# Patient Record
Sex: Female | Born: 1970 | Race: White | Hispanic: No | Marital: Married | Smoking: Current every day smoker
Health system: Southern US, Community
[De-identification: ages and names within clinical notes are randomized; demographics above are authoritative.]

## PROBLEM LIST (undated history)

## (undated) DIAGNOSIS — M549 Dorsalgia, unspecified: Secondary | ICD-10-CM

## (undated) DIAGNOSIS — F32A Depression, unspecified: Secondary | ICD-10-CM

## (undated) DIAGNOSIS — E079 Disorder of thyroid, unspecified: Secondary | ICD-10-CM

## (undated) DIAGNOSIS — G8929 Other chronic pain: Secondary | ICD-10-CM

## (undated) DIAGNOSIS — F329 Major depressive disorder, single episode, unspecified: Secondary | ICD-10-CM

## (undated) DIAGNOSIS — J45909 Unspecified asthma, uncomplicated: Secondary | ICD-10-CM

## (undated) DIAGNOSIS — F319 Bipolar disorder, unspecified: Secondary | ICD-10-CM

## (undated) DIAGNOSIS — F1110 Opioid abuse, uncomplicated: Secondary | ICD-10-CM

## (undated) DIAGNOSIS — M419 Scoliosis, unspecified: Secondary | ICD-10-CM

## (undated) HISTORY — PX: CHOLECYSTECTOMY: SHX55

---

## 2015-06-02 ENCOUNTER — Emergency Department (HOSPITAL_BASED_OUTPATIENT_CLINIC_OR_DEPARTMENT_OTHER)
Admission: EM | Admit: 2015-06-02 | Discharge: 2015-06-02 | Disposition: A | Discharge status: Home / Self Care | Payer: BLUE CROSS/BLUE SHIELD | Attending: Emergency Medicine | Admitting: Emergency Medicine

## 2015-06-02 ENCOUNTER — Encounter (HOSPITAL_BASED_OUTPATIENT_CLINIC_OR_DEPARTMENT_OTHER): Payer: Self-pay

## 2015-06-02 DIAGNOSIS — E079 Disorder of thyroid, unspecified: Secondary | ICD-10-CM | POA: Diagnosis not present

## 2015-06-02 DIAGNOSIS — M5440 Lumbago with sciatica, unspecified side: Secondary | ICD-10-CM | POA: Insufficient documentation

## 2015-06-02 DIAGNOSIS — Z79899 Other long term (current) drug therapy: Secondary | ICD-10-CM | POA: Insufficient documentation

## 2015-06-02 DIAGNOSIS — M545 Low back pain: Secondary | ICD-10-CM | POA: Diagnosis present

## 2015-06-02 DIAGNOSIS — Z87891 Personal history of nicotine dependence: Secondary | ICD-10-CM | POA: Insufficient documentation

## 2015-06-02 DIAGNOSIS — F329 Major depressive disorder, single episode, unspecified: Secondary | ICD-10-CM | POA: Insufficient documentation

## 2015-06-02 HISTORY — DX: Depression, unspecified: F32.A

## 2015-06-02 HISTORY — DX: Major depressive disorder, single episode, unspecified: F32.9

## 2015-06-02 HISTORY — DX: Disorder of thyroid, unspecified: E07.9

## 2015-06-02 HISTORY — DX: Scoliosis, unspecified: M41.9

## 2015-06-02 LAB — URINALYSIS, ROUTINE W REFLEX MICROSCOPIC
BILIRUBIN URINE: NEGATIVE
GLUCOSE, UA: NEGATIVE mg/dL
HGB URINE DIPSTICK: NEGATIVE
KETONES UR: NEGATIVE mg/dL
Leukocytes, UA: NEGATIVE
Nitrite: NEGATIVE
PROTEIN: NEGATIVE mg/dL
Specific Gravity, Urine: 1.042 — ABNORMAL HIGH (ref 1.005–1.030)
pH: 5 (ref 5.0–8.0)

## 2015-06-02 MED ORDER — OXYCODONE-ACETAMINOPHEN 5-325 MG PO TABS: 1.0000 | ORAL_TABLET | Freq: Four times a day (QID) | ORAL | Status: AC | PRN

## 2015-06-02 NOTE — ED Notes (Signed)
Pt reports chronic scoliosis pain that she needs relief from.

## 2015-06-02 NOTE — ED Provider Notes (Signed)
CSN: [id number]     Arrival date & time 06/02/15  1754 History   First MD Initiated Contact with Patient 06/02/15 1954     Chief Complaint  Patient presents with  . Back Pain     (Consider location/radiation/quality/duration/timing/severity/associated sxs/prior Treatment) HPI Comments: Patient presents today with a chief complaint of lower back pain.  She reports that the pain has been present for the past 2-3 weeks and is worsening.  She denies any injury or trauma.  She states that she had a recent MRI of her lumbar spine, which showed "arthritis" and a "bulging disc."  She states that the pain radiates to both legs.  She has been taking Tylenol and Goody Powder for the pain without any relief.  She denies fever, chills, saddle anaesthesia, or bowel/bladder incontinence.  No history of Cancer or IVDU.    Patient is a 45 y.o. female presenting with back pain. The history is provided by the patient.  Back Pain   Past Medical History  Diagnosis Date  . Scoliosis   . Depression   . Thyroid disease    Past Surgical History  Procedure Laterality Date  . Cesarean section    . Cholecystectomy     No family history on file. Social History  Substance Use Topics  . Smoking status: Former Games developer  . Smokeless tobacco: None  . Alcohol Use: No   OB History    No data available     Review of Systems  Musculoskeletal: Positive for back pain.  All other systems reviewed and are negative.     Allergies  Codeine  Home Medications   Prior to Admission medications   Medication Sig Start Date End Date Taking? Authorizing Provider  FLUoxetine (PROZAC) 10 MG capsule Take 10 mg by mouth daily.   Yes Historical Provider, MD  levothyroxine (SYNTHROID, LEVOTHROID) 100 MCG tablet Take 100 mcg by mouth daily before breakfast.   Yes Historical Provider, MD   BP 140/82 mmHg  Pulse 77  Temp(Src) 98.2 F (36.8 C) (Oral)  Resp 18  Ht  (1.6 m)  Wt 86.183 kg  BMI 33.67 kg/m2  SpO2  98% Physical Exam  Constitutional: She appears well-developed and well-nourished.  HENT:  Head: Normocephalic and atraumatic.  Neck: Normal range of motion. Neck supple.  Cardiovascular: Normal rate, regular rhythm and normal heart sounds.   Pulmonary/Chest: Effort normal and breath sounds normal.  Musculoskeletal: Normal range of motion.       Lumbar back: She exhibits tenderness and bony tenderness. She exhibits normal range of motion, no swelling, no edema and no deformity.  Neurological: She is alert. She has normal strength. No sensory deficit. Gait normal.  Reflex Scores:      Patellar reflexes are 2+ on the right side and 2+ on the left side. Skin: Skin is warm and dry.  Psychiatric: She has a normal mood and affect.  Nursing note and vitals reviewed.   ED Course  Procedures (including critical care time) Labs Review Labs Reviewed  URINALYSIS, ROUTINE W REFLEX MICROSCOPIC (NOT AT [facility]) - Abnormal; Notable for the following:    APPearance CLOUDY (*)    Specific Gravity, Urine 1.042 (*)    All other components within normal limits    Imaging Review No results found. I have personally reviewed and evaluated these images and lab results as part of my medical decision-making.   EKG Interpretation None      MDM   Final diagnoses:  None  Patient with back pain.  No neurological deficits and normal neuro exam.  Patient can walk but states is painful.  No loss of bowel or bladder control.  No concern for cauda equina.  No fever, night sweats, weight loss, h/o cancer, IVDU.  RICE protocol and pain medicine indicated and discussed with patient.   Stable for discharge.  Return precautions given.     Santiago GladHeather Shanautica Forker, PA-C 06/04/15 1635  Nelva Nayobert Beaton, MD 06/05/15 [phone]

## 2015-09-12 ENCOUNTER — Emergency Department (HOSPITAL_BASED_OUTPATIENT_CLINIC_OR_DEPARTMENT_OTHER)
Admission: EM | Admit: 2015-09-12 | Discharge: 2015-09-12 | Disposition: A | Discharge status: Home / Self Care | Payer: BLUE CROSS/BLUE SHIELD | Attending: Emergency Medicine | Admitting: Emergency Medicine

## 2015-09-12 ENCOUNTER — Encounter (HOSPITAL_BASED_OUTPATIENT_CLINIC_OR_DEPARTMENT_OTHER): Payer: Self-pay | Admitting: *Deleted

## 2015-09-12 DIAGNOSIS — Z79899 Other long term (current) drug therapy: Secondary | ICD-10-CM | POA: Insufficient documentation

## 2015-09-12 DIAGNOSIS — F329 Major depressive disorder, single episode, unspecified: Secondary | ICD-10-CM | POA: Insufficient documentation

## 2015-09-12 DIAGNOSIS — M533 Sacrococcygeal disorders, not elsewhere classified: Secondary | ICD-10-CM | POA: Insufficient documentation

## 2015-09-12 DIAGNOSIS — Z87891 Personal history of nicotine dependence: Secondary | ICD-10-CM | POA: Insufficient documentation

## 2015-09-12 MED ORDER — CYCLOBENZAPRINE HCL 10 MG PO TABS
10.0000 mg | ORAL_TABLET | Freq: Once | ORAL | Status: AC
  Administered 2015-09-12: 10 mg via ORAL
  Filled 2015-09-12: qty 1

## 2015-09-12 MED ORDER — OXYCODONE-ACETAMINOPHEN 5-325 MG PO TABS
2.0000 | ORAL_TABLET | Freq: Once | ORAL | Status: AC
  Administered 2015-09-12: 2 via ORAL
  Filled 2015-09-12: qty 2

## 2015-09-12 MED ORDER — METHYLPREDNISOLONE 4 MG PO TBPK: ORAL_TABLET | ORAL | Status: AC

## 2015-09-12 MED ORDER — ORPHENADRINE CITRATE ER 100 MG PO TB12: 100.0000 mg | ORAL_TABLET | Freq: Two times a day (BID) | ORAL | Status: AC

## 2015-09-12 MED ORDER — KETOROLAC TROMETHAMINE 60 MG/2ML IM SOLN
60.0000 mg | Freq: Once | INTRAMUSCULAR | Status: AC
  Administered 2015-09-12: 60 mg via INTRAMUSCULAR
  Filled 2015-09-12: qty 2

## 2015-09-12 NOTE — ED Notes (Signed)
Per pt report ongoing lower back ongoing , planned surgery in the future. Worked in yard and today in increase pain.

## 2015-09-12 NOTE — ED Provider Notes (Signed)
CSN: [id number]     Arrival date & time 09/12/15  1448 History   First MD Initiated Contact with Patient 09/12/15 1638     Chief Complaint  Patient presents with  . Back Pain     (Consider location/radiation/quality/duration/timing/severity/associated sxs/prior Treatment) HPI Patient does have chronic lower back pain and has had steroid injection therapy without relief. She typically is prescribed Percocet for pain control. At this time she has run out of that medication. She reports that she had been working in the yard doing yard work and weeding over the past day or 2 and now has significantly increased lower back pain. It does not radiate. No abdominal pain. No pain burning or urgency. Urination. No difficulty with bowel or bladder function. Past Medical History  Diagnosis Date  . Scoliosis   . Depression   . Thyroid disease    Past Surgical History  Procedure Laterality Date  . Cesarean section    . Cholecystectomy     History reviewed. No pertinent family history. Social History  Substance Use Topics  . Smoking status: Former Games developer  . Smokeless tobacco: None  . Alcohol Use: No   OB History    No data available     Review of Systems 10 Systems reviewed and are negative for acute change except as noted in the HPI.    Allergies  Review of patient's allergies indicates no active allergies.  Home Medications   Prior to Admission medications   Medication Sig Start Date End Date Taking? Authorizing Provider  FLUoxetine (PROZAC) 10 MG capsule Take 10 mg by mouth daily.   Yes Historical Provider, MD  levothyroxine (SYNTHROID, LEVOTHROID) 100 MCG tablet Take 100 mcg by mouth daily before breakfast.   Yes Historical Provider, MD  oxyCODONE-acetaminophen (PERCOCET/ROXICET) 5-325 MG tablet Take 1-2 tablets by mouth every 6 (six) hours as needed for severe pain. 06/02/15  Yes Heather Laisure, PA-C  methylPREDNISolone (MEDROL DOSEPAK) 4 MG TBPK tablet Dosepak as per  instructions 09/12/15   Arby Barrette, MD  orphenadrine (NORFLEX) 100 MG tablet Take 1 tablet (100 mg total) by mouth 2 (two) times daily. 09/12/15   Arby Barrette, MD   BP 141/95 mmHg  Pulse 79  Temp(Src) 98 F (36.7 C) (Oral)  Resp 20  Ht  (1.6 m)  Wt 230 lb (104.327 kg)  BMI 40.75 kg/m2  SpO2 100% Physical Exam  Constitutional: She is oriented to person, place, and time.  Morbid obesity. Alert and nontoxic. Patient is lying prone on the stretcher as I enter the room. No respiratory difficulty.  HENT:  Head: Normocephalic and atraumatic.  Eyes: EOM are normal.  Neck: Neck supple.  Cardiovascular: Normal rate, regular rhythm, normal heart sounds and intact distal pulses.   Pulmonary/Chest: Effort normal and breath sounds normal.  Abdominal: Soft. Bowel sounds are normal. She exhibits no distension. There is no tenderness.  Musculoskeletal: Normal range of motion. She exhibits tenderness. She exhibits no edema.  Patient endorses focal tenderness at the last lumbosacral vertebral body and the bilateral SI joints. Nontender thoracic and lumbar back. No CVA tenderness. Patient is able to roll over from prone to supine without difficulty. All muscle bodies functioning and full range of motion using both legs and arms to reposition herself.  A 5 out of 5 strength testing for flexion and extension of the lower extremities.  Neurological: She is alert and oriented to person, place, and time. She [id number] strength. She exhibits normal muscle tone. Coordination normal. GCS  eye subscore is 4. GCS verbal subscore is 5. GCS motor subscore is 6.  Skin: Skin is warm, dry and intact.  Psychiatric: She has a normal mood and affect.    ED Course  Procedures (including critical care time) Labs Review Labs Reviewed - No data to display  Imaging Review No results found. I have personally reviewed and evaluated these images and lab results as part of my medical decision-making.   EKG  Interpretation None      MDM   Final diagnoses:  Sacroiliac joint pain   Patient has chronic lower back pain. This is been exacerbated by doing yard work over the past day. No associated neurologic symptoms. No associated GU symptoms. No associated GI symptoms. His full and normal motor and neurologic function. Patient be treated for pain in the emergency department with an IM shot of Toradol and by mouth Percocet. Patient will be discharged with Medrol Dosepak and Norflex. She is advised to contact her family doctor as soon as possible to discuss prescriptions for chronic pain control with Percocet. She reports that the office was closed today by the time she was able to contact them.    Arby BarretteMarcy Aiyden Lauderback, MD 09/12/15 [phone]

## 2015-09-12 NOTE — Discharge Instructions (Signed)
Sacroiliac Joint Dysfunction Call your treating physician Monday to discuss ongoing management of acute on chronic back pain. Narcotic pain prescriptions will need to be provided by her treating physician. Take Medrol dosepak as prescribed and Norflex (muscle relaxer) twice daily. Sacroiliac joint dysfunction is a condition that causes inflammation on one or both sides of the sacroiliac (SI) joint. The SI joint connects the lower part of the spine (sacrum) with the two upper portions of the pelvis (ilium). This condition causes deep aching or burning pain in the low back. In some cases, the pain may also spread into one or both buttocks or hips or spread down the legs. CAUSES This condition may be caused by:  Pregnancy. During pregnancy, extra stress is put on the SI joints because the pelvis widens.  Injury, such as:  Car accidents.  Sport-related injuries.  Work-related injuries.  Having one leg that is shorter than the other.  Conditions that affect the joints, such as:  Rheumatoid arthritis.  Gout.  Psoriatic arthritis.  Joint infection (septic arthritis). Sometimes, the cause of SI joint dysfunction is not known. SYMPTOMS Symptoms of this condition include:  Aching or burning pain in the lower back. The pain may also spread to other areas, such as:  Buttocks.  Groin.  Thighs and legs.  Muscle spasms in or around the painful areas.  Increased pain when standing, walking, running, stair climbing, bending, or lifting. DIAGNOSIS Your health care provider will do a physical exam and take your medical history. During the exam, the health care provider may move one or both of your legs to different positions to check for pain. Various tests may be done to help verify the diagnosis, including:  Imaging tests to look for other causes of pain. These may include:  MRI.  CT scan.  Bone scan.  Diagnostic injection. A numbing medicine is injected into the SI joint using a  needle. If the pain is temporarily improved or stopped after the injection, this can indicate that SI joint dysfunction is the problem. TREATMENT Treatment may vary depending on the cause and severity of your condition. Treatment options may include:  Applying ice or heat to the lower back area. This can help to reduce pain and muscle spasms.  Medicines to relieve pain or inflammation or to relax the muscles.  Wearing a back brace (sacroiliac brace) to help support the joint while your back is healing.  Physical therapy to increase muscle strength around the joint and flexibility at the joint. This may also involve learning proper body positions and ways of moving to relieve stress on the joint.  Direct manipulation of the SI joint.  Injections of steroid medicine into the joint in order to reduce pain and swelling.  Radiofrequency ablation to burn away nerves that are carrying pain messages from the joint.  Use of a device that provides electrical stimulation in order to reduce pain at the joint.  Surgery to put in screws and plates that limit or prevent joint motion. This is rare. HOME CARE INSTRUCTIONS  Rest as needed. Limit your activities as directed by your health care provider.  Take medicines only as directed by your health care provider.  If directed, apply ice to the affected area:  Put ice in a plastic bag.  Place a towel between your skin and the bag.  Leave the ice on for 20 minutes, 2-3 times per day.  Use a heating pad or a moist heat pack as directed by your health care  provider.  Exercise as directed by your health care provider or physical therapist.  Keep all follow-up visits as directed by your health care provider. This is important. SEEK MEDICAL CARE IF:  Your pain is not controlled with medicine.  You have a fever.  You have increasingly severe pain. SEEK IMMEDIATE MEDICAL CARE IF:  You have weakness, numbness, or tingling in your legs or  feet.  You lose control of your bladder or bowel.   This information is not intended to replace advice given to you by your health care provider. Make sure you discuss any questions you have with your health care provider.   Document Released: 06/11/2008 Document Revised: 07/30/2014 Document Reviewed: 11/20/2013 Elsevier Interactive Patient Education 2016 [employer].  [employer] Guide Outpatient Counseling/Substance Abuse Adult The United Ways 211 is a great source of information about community services available.  Access by dialing 2-1-1 from anywhere in [place], or by website -  PooledIncome.pl.   Other Local Resources (Updated 03/2015)  Crisis Hotlines   Services     Area Served  [employer]  Crisis Hotline, available 24 hours a day, 7 days a week: 832-725-8869 [facility], [place]   [facility]  Crisis Hotline, available 24 hours a day, 7 days a week: 705-017-3507 [facility], [place]  [facility]  Suicide Prevention Hotline, available 24 hours a day, 7 days a week: (647)062-8804 [facility], [place]  [employer], available 24 hours a day, 7 days a week: 660-579-5804 [facility], [place]   [facility] Access to [employer], available 24 hours a day, 7 days a week: 229-879-7104 All   [facility]  Crisis Hotline, available 24 hours a day, 7 days a week: 413-319-6386 All   Other Local Resources (Updated 03/2015)  Outpatient Counseling/ Substance Abuse Programs  Services     Address and Phone Number  ADS (Alcohol and Drug Services)   Options include Individual counseling, group counseling, intensive outpatient program (several hours a day, several days a week)  Offers depression assessments  Provides methadone maintenance program [phone] [address] [place], [place] [address]   [facility]   Offers partial hospitalization/day  treatment and DUI/DWI programs  [employer], private insurance [phone] [address] [place], [place] [address]  [facility]    Services include intensive outpatient program (several hours a day, several days a week), outpatient treatment, DUI/DWI services, family education  Also has some services specifically for [employer] transitional housing  [phone] [address] [place], [place] [address]     [facility]  [employer], private pay, and private insurance [phone] [address], [address] [place], [place] [address]  [employer] [facility]  Services include individual counseling, substance abuse intensive outpatient program (several hours a day, several days a week), day treatment  Delene Loll, Medicaid, private insurance [phone] [address] [place], [place] [address]  [facility]   Offers substance abuse intensive outpatient program (several hours a day, several days a week), partial hospitalization program [phone] [address] [place], [place] [address]  [phone] [address] [place], [place] [address]  [phone] [address] [place], [place] [address]  [phone] [phone], [address] [place], [place] [address]  [facility]  Individual counseling only  Accepts private insurance only [phone] [address] [place], [place] [address]  [facility]  [facility]  Methadone maintenance program [phone] [address] [place], [place] [address]  [facility]  Walk-In Clinic providing substance abuse and mental health counseling  Accepts Medicaid, Medicare, private insurance  Offers sliding scale for uninsured [phone] [address] [place], [place]   [facility] [employer]  Offers individual counseling, and intensive in-home services [phone] [address], [address] [place], [place] [address]  [facility]  Offers individual counseling, family counseling, group therapy, domestic violence counseling, consumer credit counseling  Accepts Medicare, Medicaid, private insurance  Offers sliding scale for uninsured [phone] [address] [place], [place] [address]  [phone] [facility], [address] [place], [place] [address]  [facility]  Offers individual, family and group counseling  3 locations - [place], [place], and [place]  [phone]  [address] [place], [place] [address]  [address] [place], [place] [address]  [address] [place], [place] [address]  [facility]    Offers psychiatric assessment, 8-week Intensive Outpatient Program (several hours a day, several times a week, daytime or evenings), early recovery group, family Program, medication management  Private pay or private insurance only [phone], or  [phone] [address] [place], [place] [address]  [facility]  Offers individual, couples and family counseling  Accepts Medicaid, private insurance, and sliding scale for uninsured [phone] [address] [place], [place] [address]  Len Blalockavid Fuller, MD  Individual counseling  Private insurance [phone] [address] [place], [place] [address]  [facility]   Offers assessment, substance abuse treatment, and behavioral health treatment [phone] [address] [place], [place] [address]  [facility]  Individual counseling  Accepts private insurance [phone] [address] [place], [place] [address]  [facility]  Individual counseling  Delene Lollccepts Medicare, private insurance [phone] [address] [place], [place] [address]  [facility]    Offers intensive outpatient program (several hours a day, several times a  week)  Private pay, private insurance [phone] [facility] [place], [place]  [facility]  Individual counseling  Medicare, private insurance [phone] [address], [address] [place], [place] [address]  [facility]    Offers intensive outpatient program (several hours a day, several times a week) and partial hospitalization program [phone] [place], [place] [address]  Emerson MonteParrish McKinney, MD  Individual counseling [phone] [address], [address] [place], [place] [address]  [facility]  Offers Christian counseling to individuals, couples, and families  Accepts Medicare and private insurance; offers sliding scale for uninsured [phone] [address] [place], [place] [address]  [facility] [phone] [address], [address] [place], [place] [address]  [facility]   Offers crisis counseling, individual counseling, group therapy, in-home therapy, domestic violence services, day treatment, DWI services, Administrator, artsCommunity Support Team (CST), Assertive Community Treatment Team (ACTT), substance abuse Intensive Outpatient Program (several hours a day, several times a week)  2 locations - [place] and [place] [phone] [address] [place], [place] [address]  [phone] [address] [place], [place] [address]  [facility]     Individual counseling and group therapy  Accepts private insurance, [place], [place]icaid [phone] [address] [place], [place]  [facility]  Offers individual and family counseling  Offers LGBTQ services  Accepts private insurance and private pay [phone] [address] [place], [place] [address]  [facility]    Offers individual counseling, group therapy, and outpatient detox  Accepts private insurance [phone] [address] [place], [place]  [facility]  Individual counseling  Accepts Medicare, private insurance [phone] [address] [place], [place] [address]  [employer]  Individual counseling  Accepts Medicare, private insurance [phone] [address] [place], [place] [address]  [facility]   Offers substance abuse Intensive Outpatient Program (several hours a day, several times a week) [phone], or [phone] [place], [place]

## 2015-09-12 NOTE — ED Notes (Signed)
Encouraged Pt. To seek attention for other needs and gave paper work according to further conversation with Pt.   Pt. Receptive to help and in need of a listening ear before discharge from the facility.

## 2015-09-13 ENCOUNTER — Emergency Department (HOSPITAL_BASED_OUTPATIENT_CLINIC_OR_DEPARTMENT_OTHER)
Admission: EM | Admit: 2015-09-13 | Discharge: 2015-09-13 | Disposition: A | Discharge status: Home / Self Care | Payer: BLUE CROSS/BLUE SHIELD | Attending: Emergency Medicine | Admitting: Emergency Medicine

## 2015-09-13 ENCOUNTER — Encounter (HOSPITAL_BASED_OUTPATIENT_CLINIC_OR_DEPARTMENT_OTHER): Payer: Self-pay | Admitting: Emergency Medicine

## 2015-09-13 DIAGNOSIS — Z87891 Personal history of nicotine dependence: Secondary | ICD-10-CM | POA: Insufficient documentation

## 2015-09-13 DIAGNOSIS — Z79899 Other long term (current) drug therapy: Secondary | ICD-10-CM | POA: Insufficient documentation

## 2015-09-13 DIAGNOSIS — M5440 Lumbago with sciatica, unspecified side: Secondary | ICD-10-CM | POA: Insufficient documentation

## 2015-09-13 DIAGNOSIS — R509 Fever, unspecified: Secondary | ICD-10-CM | POA: Insufficient documentation

## 2015-09-13 DIAGNOSIS — M545 Low back pain: Secondary | ICD-10-CM

## 2015-09-13 MED ORDER — HYDROMORPHONE HCL 1 MG/ML IJ SOLN
2.0000 mg | Freq: Once | INTRAMUSCULAR | Status: AC
  Administered 2015-09-13: 2 mg via INTRAMUSCULAR
  Filled 2015-09-13: qty 2

## 2015-09-13 MED ORDER — ETODOLAC 500 MG PO TABS: 500.0000 mg | ORAL_TABLET | Freq: Two times a day (BID) | ORAL | Status: AC

## 2015-09-13 MED ORDER — IBUPROFEN 400 MG PO TABS
600.0000 mg | ORAL_TABLET | Freq: Once | ORAL | Status: AC
  Administered 2015-09-13: 600 mg via ORAL
  Filled 2015-09-13: qty 1

## 2015-09-13 MED ORDER — OXYCODONE-ACETAMINOPHEN 5-325 MG PO TABS
1.0000 | ORAL_TABLET | Freq: Once | ORAL | Status: AC
  Administered 2015-09-13: 1 via ORAL
  Filled 2015-09-13: qty 1

## 2015-09-13 NOTE — ED Notes (Signed)
MD at bedside. 

## 2015-09-13 NOTE — ED Provider Notes (Signed)
CSN: [id number]     Arrival date & time 09/13/15  1922 History  By signing my name below, I, Karen Montoya, attest that this documentation has been prepared under the direction and in the presence of Karen DibblesJon Jaemarie Hochberg, MD. Electronically Signed: Bethel BornBritney Montoya, ED Scribe. 09/13/2015. 8:32 PM    Chief Complaint  Patient presents with  . Back Pain   The history is provided by the patient. No language interpreter was used.   Karen Montoya is a 45 y.o. female who presents to the Emergency Department complaining of constant, dull, 10/10 in severity,  bilateral back pain with onset "a couple months ago". She has been seen by Dr. Chilton SiGreen and diagnosed with sciatica and a herniated disk. She was given steroid injections for the pain with no relief. The pain radiates to the knees bilaterally. Tylenol and the application of hot water also provided insufficient pain relief at home.  Pt believes that this exacerbation was caused by doing yard work.  Pt denies fever and difficulty urinating.   Past Medical History  Diagnosis Date  . Scoliosis   . Depression   . Thyroid disease    Past Surgical History  Procedure Laterality Date  . Cesarean section    . Cholecystectomy     History reviewed. No pertinent family history. Social History  Substance Use Topics  . Smoking status: Former Games developermoker  . Smokeless tobacco: None  . Alcohol Use: No   OB History    No data available     Review of Systems  Constitutional: Positive for fever.  Genitourinary: Negative for difficulty urinating.  Musculoskeletal: Positive for back pain.  All other systems reviewed and are negative.  Allergies  Review of patient's allergies indicates no known allergies.  Home Medications   Prior to Admission medications   Medication Sig Start Date End Date Taking? Authorizing Provider  FLUoxetine (PROZAC) 10 MG capsule Take 10 mg by mouth daily.   Yes Historical Provider, MD  levothyroxine (SYNTHROID, LEVOTHROID) 100 MCG  tablet Take 100 mcg by mouth daily before breakfast.   Yes Historical Provider, MD  etodolac (LODINE) 500 MG tablet Take 1 tablet (500 mg total) by mouth 2 (two) times daily. 09/13/15   Karen DibblesJon Avari Gelles, MD  methylPREDNISolone (MEDROL DOSEPAK) 4 MG TBPK tablet Dosepak as per instructions 09/12/15   Arby BarretteMarcy Pfeiffer, MD  orphenadrine (NORFLEX) 100 MG tablet Take 1 tablet (100 mg total) by mouth 2 (two) times daily. 09/12/15   Arby BarretteMarcy Pfeiffer, MD  oxyCODONE-acetaminophen (PERCOCET/ROXICET) 5-325 MG tablet Take 1-2 tablets by mouth every 6 (six) hours as needed for severe pain. 06/02/15   Heather Laisure, PA-C   BP 145/72 mmHg  Pulse 85  Temp(Src) 98.1 F (36.7 C) (Oral)  Resp 20  Ht 5\' 3"  (1.6 m)  Wt 216 lb (97.977 kg)  BMI 38.27 kg/m2  SpO2 100% Physical Exam  Constitutional: She appears well-developed and well-nourished. No distress.  HENT:  Head: Normocephalic and atraumatic.  Right Ear: External ear normal.  Left Ear: External ear normal.  Eyes: Conjunctivae are normal. Right eye exhibits no discharge. Left eye exhibits no discharge. No scleral icterus.  Neck: Neck supple. No tracheal deviation present.  Cardiovascular: Normal rate.   Pulmonary/Chest: Effort normal. No stridor. No respiratory distress.  Musculoskeletal: She exhibits no edema.       Lumbar back: She exhibits tenderness and bony tenderness. She exhibits no swelling, no edema and no deformity.  Normal sensation and strength in the feet. Normal plantar and dorsiflexion.  Neurological: She is alert. Cranial nerve deficit: no gross deficits.  Skin: Skin is warm and dry. No rash noted.  Psychiatric: She has a normal mood and affect.  Nursing note and vitals reviewed.   ED Course  Procedures (including critical care time) DIAGNOSTIC STUDIES: Oxygen Saturation is 100% on RA,  normal by my interpretation.    COORDINATION OF CARE: 8:29 PM Discussed treatment plan which includes pain medication with pt at bedside and pt agreed to  plan.  Medications  oxyCODONE-acetaminophen (PERCOCET/ROXICET) 5-325 MG per tablet 1 tablet (not administered)  ibuprofen (ADVIL,MOTRIN) tablet 600 mg (not administered)  HYDROmorphone (DILAUDID) injection 2 mg (2 mg Intramuscular Given 09/13/15 2052)     MDM   Final diagnoses:  Low back pain, unspecified back pain laterality, with sciatica presence unspecified   Chronic back pain without acute deficits. Discussed pain management with patient.  She requested narcotic prescriptions.  I explained that it would be appropriate to address this with her doctor.  Will give a dose of percocet before she goes.  Rx lodine for pain.  Continue meds from the other day.  I personally performed the services described in this documentation, which was scribed in my presence.  The recorded information has been reviewed and is accurate.    Karen Dibbles, MD 09/13/15 2216

## 2015-09-13 NOTE — ED Notes (Addendum)
[facility], pt seen by MD prior to RN assessments, see MD notes, pending orders, pt lying prone on elbows, NAD, calm, interactive, [facility]

## 2015-09-13 NOTE — Discharge Instructions (Signed)

## 2015-09-13 NOTE — ED Notes (Signed)
Pt reports history of chronic back pain x 3 months with radiation of pain to bilteral knees, denies voiding problems, pt states she is currently under the care of dr. August Saucerean [facility] for evaluation of cause contributing to pain

## 2016-06-24 ENCOUNTER — Emergency Department (HOSPITAL_BASED_OUTPATIENT_CLINIC_OR_DEPARTMENT_OTHER)
Admission: EM | Admit: 2016-06-24 | Discharge: 2016-06-24 | Disposition: A | Discharge status: Home / Self Care | Payer: Managed Care, Other (non HMO) | Attending: Emergency Medicine | Admitting: Emergency Medicine

## 2016-06-24 ENCOUNTER — Encounter (HOSPITAL_BASED_OUTPATIENT_CLINIC_OR_DEPARTMENT_OTHER): Payer: Self-pay | Admitting: Emergency Medicine

## 2016-06-24 DIAGNOSIS — F1193 Opioid use, unspecified with withdrawal: Secondary | ICD-10-CM

## 2016-06-24 DIAGNOSIS — F17200 Nicotine dependence, unspecified, uncomplicated: Secondary | ICD-10-CM | POA: Diagnosis not present

## 2016-06-24 DIAGNOSIS — F1123 Opioid dependence with withdrawal: Secondary | ICD-10-CM | POA: Diagnosis present

## 2016-06-24 HISTORY — DX: Other chronic pain: G89.29

## 2016-06-24 HISTORY — DX: Dorsalgia, unspecified: M54.9

## 2016-06-24 MED ORDER — LOPERAMIDE HCL 2 MG PO CAPS: 2.0000 mg | ORAL_CAPSULE | ORAL | Status: DC | PRN

## 2016-06-24 MED ORDER — HYDROXYZINE HCL 25 MG PO TABS
25.0000 mg | ORAL_TABLET | Freq: Once | ORAL | Status: AC
Start: 2016-06-24 — End: 2016-06-24
  Administered 2016-06-24: 25 mg via ORAL
  Filled 2016-06-24: qty 1

## 2016-06-24 MED ORDER — METHOCARBAMOL 500 MG PO TABS: 500.0000 mg | ORAL_TABLET | Freq: Two times a day (BID) | ORAL | 0 refills | Status: AC

## 2016-06-24 MED ORDER — LORAZEPAM 2 MG/ML IJ SOLN
0.5000 mg | Freq: Once | INTRAMUSCULAR | Status: AC
  Administered 2016-06-24: 0.5 mg via INTRAVENOUS
  Filled 2016-06-24: qty 1

## 2016-06-24 MED ORDER — ONDANSETRON 4 MG PO TBDP
4.0000 mg | ORAL_TABLET | Freq: Four times a day (QID) | ORAL | Status: DC | PRN
Start: 2016-06-24 — End: 2016-06-24

## 2016-06-24 MED ORDER — HYDROXYZINE HCL 25 MG PO TABS: 25.0000 mg | ORAL_TABLET | Freq: Four times a day (QID) | ORAL | 0 refills | Status: AC

## 2016-06-24 MED ORDER — HYDROXYZINE HCL 25 MG PO TABS: 25.0000 mg | ORAL_TABLET | Freq: Four times a day (QID) | ORAL | Status: DC | PRN

## 2016-06-24 MED ORDER — ONDANSETRON 4 MG PO TBDP
4.0000 mg | ORAL_TABLET | Freq: Three times a day (TID) | ORAL | 0 refills | Status: AC | PRN
Start: 2016-06-24 — End: ?

## 2016-06-24 MED ORDER — DICYCLOMINE HCL 20 MG PO TABS: 20.0000 mg | ORAL_TABLET | Freq: Two times a day (BID) | ORAL | 0 refills | Status: AC

## 2016-06-24 MED ORDER — METHOCARBAMOL 500 MG PO TABS: 500.0000 mg | ORAL_TABLET | Freq: Three times a day (TID) | ORAL | Status: DC | PRN

## 2016-06-24 MED ORDER — LOPERAMIDE HCL 2 MG PO CAPS: 2.0000 mg | ORAL_CAPSULE | Freq: Four times a day (QID) | ORAL | 0 refills | Status: AC | PRN

## 2016-06-24 MED ORDER — SODIUM CHLORIDE 0.9 % IV BOLUS (SEPSIS)
1000.0000 mL | Freq: Once | INTRAVENOUS | Status: AC
  Administered 2016-06-24: 1000 mL via INTRAVENOUS

## 2016-06-24 MED ORDER — ONDANSETRON HCL 4 MG/2ML IJ SOLN
4.0000 mg | Freq: Once | INTRAMUSCULAR | Status: AC
  Administered 2016-06-24: 4 mg via INTRAVENOUS
  Filled 2016-06-24: qty 2

## 2016-06-24 MED ORDER — DICYCLOMINE HCL 20 MG PO TABS
20.0000 mg | ORAL_TABLET | Freq: Four times a day (QID) | ORAL | Status: DC | PRN
  Filled 2016-06-24: qty 1

## 2016-06-24 MED ORDER — NAPROXEN 250 MG PO TABS: 500.0000 mg | ORAL_TABLET | Freq: Two times a day (BID) | ORAL | Status: DC | PRN

## 2016-06-24 MED ORDER — DICYCLOMINE HCL 10 MG PO CAPS
10.0000 mg | ORAL_CAPSULE | Freq: Once | ORAL | Status: AC
Start: 2016-06-24 — End: 2016-06-24
  Administered 2016-06-24: 10 mg via ORAL
  Filled 2016-06-24: qty 1

## 2016-06-24 MED ORDER — IBUPROFEN 600 MG PO TABS: 600.0000 mg | ORAL_TABLET | Freq: Four times a day (QID) | ORAL | 0 refills | Status: AC | PRN

## 2016-06-24 MED ORDER — KETOROLAC TROMETHAMINE 30 MG/ML IJ SOLN
30.0000 mg | Freq: Once | INTRAMUSCULAR | Status: AC
  Administered 2016-06-24: 30 mg via INTRAVENOUS
  Filled 2016-06-24: qty 1

## 2016-06-24 MED ORDER — LOPERAMIDE HCL 2 MG PO CAPS
4.0000 mg | ORAL_CAPSULE | Freq: Once | ORAL | Status: AC
  Administered 2016-06-24: 4 mg via ORAL
  Filled 2016-06-24: qty 2

## 2016-06-24 NOTE — ED Provider Notes (Signed)
[facility] Provider Note   CSN: [id number] Arrival date & time: 06/24/16  0716     History   Chief Complaint Chief Complaint  Patient presents with  . withdrawal symptoms    HPI Karen Montoya is a 46 y.o. female.  Pt presents to the ED today with withdrawal symptoms from percocet.  Pt has been on percocet for 2 years for her chronic back pain.  The pt said that her doctor stopped prescribing it for her.  The pt said that she has not had any in 24 hours and she has n/v/d.  She has aches and chills.  The pt wants to get off pain meds.       Past Medical History:  Diagnosis Date  . Chronic back pain   . Depression   . Scoliosis   . Thyroid disease     There are no active problems to display for this patient.   Past Surgical History:  Procedure Laterality Date  . CESAREAN SECTION    . CHOLECYSTECTOMY      OB History    No data available       Home Medications    Prior to Admission medications   Medication Sig Start Date End Date Taking? Authorizing Provider  dicyclomine (BENTYL) 20 MG tablet Take 1 tablet (20 mg total) by mouth 2 (two) times daily. 06/24/16   Jacalyn LefevreJulie Sue Fernicola, MD  etodolac (LODINE) 500 MG tablet Take 1 tablet (500 mg total) by mouth 2 (two) times daily. 09/13/15   Linwood DibblesJon Knapp, MD  FLUoxetine (PROZAC) 10 MG capsule Take 10 mg by mouth daily.    Historical Provider, MD  hydrOXYzine (ATARAX/VISTARIL) 25 MG tablet Take 1 tablet (25 mg total) by mouth every 6 (six) hours. 06/24/16   Jacalyn LefevreJulie Naava Janeway, MD  levothyroxine (SYNTHROID, LEVOTHROID) 100 MCG tablet Take 100 mcg by mouth daily before breakfast.    Historical Provider, MD  loperamide (IMODIUM) 2 MG capsule Take 1 capsule (2 mg total) by mouth 4 (four) times daily as needed for diarrhea or loose stools. 06/24/16   Jacalyn LefevreJulie Desera Graffeo, MD  methocarbamol (ROBAXIN) 500 MG tablet Take 1 tablet (500 mg total) by mouth 2 (two) times daily. 06/24/16   Jacalyn LefevreJulie Koren Sermersheim, MD  methylPREDNISolone  (MEDROL DOSEPAK) 4 MG TBPK tablet Dosepak as per instructions 09/12/15   Arby BarretteMarcy Pfeiffer, MD  ondansetron (ZOFRAN ODT) 4 MG disintegrating tablet Take 1 tablet (4 mg total) by mouth every 8 (eight) hours as needed for nausea or vomiting. 06/24/16   Jacalyn LefevreJulie Daxton Nydam, MD  orphenadrine (NORFLEX) 100 MG tablet Take 1 tablet (100 mg total) by mouth 2 (two) times daily. 09/12/15   Arby BarretteMarcy Pfeiffer, MD  oxyCODONE-acetaminophen (PERCOCET/ROXICET) 5-325 MG tablet Take 1-2 tablets by mouth every 6 (six) hours as needed for severe pain. 06/02/15   Santiago GladHeather Laisure, PA-C    Family History No family history on file.  Social History Social History  Substance Use Topics  . Smoking status: Current Every Day Smoker  . Smokeless tobacco: Never Used  . Alcohol use No     Allergies   Patient has no known allergies.   Review of Systems Review of Systems  Constitutional: Positive for chills.  Gastrointestinal: Positive for nausea and vomiting.  Musculoskeletal: Positive for arthralgias and myalgias.  All other systems reviewed and are negative.    Physical Exam Updated Vital Signs BP (!) 133/98   Pulse 71   Temp 98 F (36.7 C)   Resp 16   SpO2 100%  Physical Exam  Constitutional: She is oriented to person, place, and time. She appears well-developed and well-nourished.  HENT:  Head: Normocephalic and atraumatic.  Right Ear: External ear normal.  Left Ear: External ear normal.  Nose: Nose normal.  Mouth/Throat: Oropharynx is clear and moist.  Eyes: Conjunctivae and EOM are normal. Pupils are equal, round, and reactive to light.  Neck: Normal range of motion. Neck supple.  Cardiovascular: Normal rate, regular rhythm, normal heart sounds and intact distal pulses.   Pulmonary/Chest: Effort normal and breath sounds normal.  Abdominal: Soft. Bowel sounds are normal.  Musculoskeletal: Normal range of motion.  Neurological: She is alert and oriented to person, place, and time.  Skin: Skin is warm.    Psychiatric: Her behavior is normal. Judgment and thought content normal. Her mood appears anxious.  Nursing note and vitals reviewed.    ED Treatments / Results  Labs (all labs ordered are listed, but only abnormal results are displayed) Labs Reviewed - No data to display  EKG  EKG Interpretation None       Radiology No results found.  Procedures Procedures (including critical care time)  Medications Ordered in ED Medications  sodium chloride 0.9 % bolus 1,000 mL (0 mLs Intravenous Stopped 06/24/16 0928)  ondansetron (ZOFRAN) injection 4 mg (4 mg Intravenous Given 06/24/16 0751)  ketorolac (TORADOL) 30 MG/ML injection 30 mg (30 mg Intravenous Given 06/24/16 0751)  loperamide (IMODIUM) capsule 4 mg (4 mg Oral Given 06/24/16 0753)  dicyclomine (BENTYL) capsule 10 mg (10 mg Oral Given 06/24/16 0753)  hydrOXYzine (ATARAX/VISTARIL) tablet 25 mg (25 mg Oral Given 06/24/16 0906)  LORazepam (ATIVAN) injection 0.5 mg (0.5 mg Intravenous Given 06/24/16 0905)     Initial Impression / Assessment and Plan / ED Course  I have reviewed the triage vital signs and the nursing notes.  Pertinent labs & imaging results that were available during my care of the patient were reviewed by me and considered in my medical decision making (see chart for details).    Pt is still feeling antsy and uncomfortable, but she is looking better.  Her vitals are normal.  Pt is not vomiting.  She will be d/c on a number of meds to help with her detox.  She is given a Facilities manager with phone numbers she can call for specific opiate treatment.    Final Clinical Impressions(s) / ED Diagnoses   Final diagnoses:  Opiate withdrawal (HCC)    New Prescriptions New Prescriptions   DICYCLOMINE (BENTYL) 20 MG TABLET    Take 1 tablet (20 mg total) by mouth 2 (two) times daily.   HYDROXYZINE (ATARAX/VISTARIL) 25 MG TABLET    Take 1 tablet (25 mg total) by mouth every 6 (six) hours.   LOPERAMIDE (IMODIUM) 2 MG  CAPSULE    Take 1 capsule (2 mg total) by mouth 4 (four) times daily as needed for diarrhea or loose stools.   METHOCARBAMOL (ROBAXIN) 500 MG TABLET    Take 1 tablet (500 mg total) by mouth 2 (two) times daily.   ONDANSETRON (ZOFRAN ODT) 4 MG DISINTEGRATING TABLET    Take 1 tablet (4 mg total) by mouth every 8 (eight) hours as needed for nausea or vomiting.     Jacalyn Lefevre, MD 06/24/16 [phone]

## 2016-06-24 NOTE — ED Triage Notes (Signed)
Pt states she has been taking percocet for back pain for 2 years and her doctor has now stopped prescribing it for her. She has not had any in 24 hours and is having withdrawal symptoms consisting cold sweats, N/V/D, and joint pain. Pt states she bought pain pills off the street but doesn't want to do that and wants help getting off meds all together.

## 2016-06-24 NOTE — ED Notes (Signed)
ED Provider at bedside. 

## 2019-08-28 ENCOUNTER — Other Ambulatory Visit: Payer: Self-pay

## 2019-08-28 ENCOUNTER — Encounter (HOSPITAL_BASED_OUTPATIENT_CLINIC_OR_DEPARTMENT_OTHER): Payer: Self-pay | Admitting: Emergency Medicine

## 2019-08-28 ENCOUNTER — Emergency Department (HOSPITAL_BASED_OUTPATIENT_CLINIC_OR_DEPARTMENT_OTHER)
Admission: EM | Admit: 2019-08-28 | Discharge: 2019-08-28 | Disposition: A | Discharge status: Home / Self Care | Payer: Managed Care, Other (non HMO) | Attending: Emergency Medicine | Admitting: Emergency Medicine

## 2019-08-28 ENCOUNTER — Emergency Department (HOSPITAL_BASED_OUTPATIENT_CLINIC_OR_DEPARTMENT_OTHER): Payer: Managed Care, Other (non HMO)

## 2019-08-28 DIAGNOSIS — F17210 Nicotine dependence, cigarettes, uncomplicated: Secondary | ICD-10-CM | POA: Diagnosis not present

## 2019-08-28 DIAGNOSIS — R0602 Shortness of breath: Secondary | ICD-10-CM | POA: Insufficient documentation

## 2019-08-28 DIAGNOSIS — R05 Cough: Secondary | ICD-10-CM | POA: Diagnosis not present

## 2019-08-28 HISTORY — DX: Bipolar disorder, unspecified: F31.9

## 2019-08-28 HISTORY — DX: Unspecified asthma, uncomplicated: J45.909

## 2019-08-28 HISTORY — DX: Opioid abuse, uncomplicated: F11.10

## 2019-08-28 MED ORDER — AZITHROMYCIN 250 MG PO TABS: 250.0000 mg | ORAL_TABLET | Freq: Every day | ORAL | 0 refills | Status: AC

## 2019-08-28 MED ORDER — ALBUTEROL SULFATE (2.5 MG/3ML) 0.083% IN NEBU
5.0000 mg | INHALATION_SOLUTION | Freq: Once | RESPIRATORY_TRACT | Status: AC
  Administered 2019-08-28: 5 mg via RESPIRATORY_TRACT
  Filled 2019-08-28: qty 6

## 2019-08-28 MED ORDER — IPRATROPIUM BROMIDE 0.02 % IN SOLN
1.0000 mg | Freq: Once | RESPIRATORY_TRACT | Status: AC
Start: 2019-08-28 — End: 2019-08-28
  Administered 2019-08-28: 1 mg via RESPIRATORY_TRACT

## 2019-08-28 MED ORDER — AZITHROMYCIN 250 MG PO TABS
500.0000 mg | ORAL_TABLET | Freq: Once | ORAL | Status: AC
  Administered 2019-08-28: 500 mg via ORAL
  Filled 2019-08-28: qty 2

## 2019-08-28 MED ORDER — SODIUM CHLORIDE FLUSH 0.9 % IV SOLN
5.00 | INTRAVENOUS | Status: DC
Start: ? — End: 2019-08-28

## 2019-08-28 MED ORDER — ALBUTEROL SULFATE (2.5 MG/3ML) 0.083% IN NEBU
5.0000 mg | INHALATION_SOLUTION | Freq: Once | RESPIRATORY_TRACT | Status: AC
  Administered 2019-08-28: 5 mg via RESPIRATORY_TRACT

## 2019-08-28 MED ORDER — IPRATROPIUM BROMIDE 0.02 % IN SOLN
RESPIRATORY_TRACT | Status: AC
  Administered 2019-08-28: 0.5 mg
  Filled 2019-08-28: qty 2.5

## 2019-08-28 MED ORDER — PREDNISONE 10 MG PO TABS
60.0000 mg | ORAL_TABLET | Freq: Once | ORAL | Status: AC
  Administered 2019-08-28: 60 mg via ORAL
  Filled 2019-08-28: qty 1

## 2019-08-28 MED ORDER — ALBUTEROL SULFATE (2.5 MG/3ML) 0.083% IN NEBU
INHALATION_SOLUTION | RESPIRATORY_TRACT | Status: AC
  Administered 2019-08-28: 2.5 mg
  Filled 2019-08-28: qty 3

## 2019-08-28 NOTE — ED Triage Notes (Addendum)
Pt with cough x 2 days and increased shob. Pt was seen 5/30 at [facility] for same and discharged with prednisone.

## 2019-08-28 NOTE — ED Provider Notes (Signed)
Emergency Department Provider Note  I have reviewed the triage vital signs and the nursing notes.  HISTORY  Chief Complaint Shortness of Breath   HPI Karen Montoya is a 49 y.o. female who presents the emergency department today with wheezing.  Patient has a history of the same.  She states that this is similar to her previous episodes of bronchitis for which she is needed antibiotics.  She seen a couple days ago at [facility] emergency department where she was noted to have normal oxygenation and diffuse wheezing which she stated was her normal after multiple breathing treatments.  She states she has had chest pain on and off again for many weeks now.  She states it feels like pressure when she lays down but is more associated with coughing and difficulty breathing.  No fevers.  Some productive cough.  States she has been using her inhalers as instructed at home and is on her steroids as instructed.   No other associated or modifying symptoms.    Past Medical History:  Diagnosis Date  . Asthma   . Bipolar affective (HCC)   . Chronic back pain   . Depression   . Opiate abuse, continuous (HCC)   . Scoliosis   . Thyroid disease     There are no problems to display for this patient.   Past Surgical History:  Procedure Laterality Date  . CESAREAN SECTION    . CHOLECYSTECTOMY      Current Outpatient Rx  . Order #: [id number] Class: Normal  . Order #: [id number] Class: Print  . Order #: [id number] Class: Print  . Order #: [id number] Class: Historical Med  . Order #: [id number] Class: Print  . Order #: [id number] Class: Print  . Order #: [id number] Class: Historical Med  . Order #: [id number] Class: Print  . Order #: [id number] Class: Print  . Order #: [id number] Class: Print  . Order #: [id number] Class: Print  . Order #: [id number] Class: Print  . Order #: [id number] Class: Print    Allergies Patient has no known allergies.  No family history on file.  Social History Social  History   Tobacco Use  . Smoking status: Current Every Day Smoker  . Smokeless tobacco: Never Used  Substance Use Topics  . Alcohol use: No  . Drug use: No    Review of Systems  All other systems negative except as documented in the HPI. All pertinent positives and negatives as reviewed in the HPI. ____________________________________________  PHYSICAL EXAM:  VITAL SIGNS: ED Triage Vitals  Enc Vitals Group     BP 08/28/19 0430 137/87     Pulse Rate 08/28/19 0430 66     Resp 08/28/19 0430 (!) 28     Temp --      Temp src --      SpO2 08/28/19 0430 100 %     Weight 08/28/19 0427 230 lb (104.3 kg)     Height 08/28/19 0427 5' 2.5" (1.588 m)    Constitutional: Alert and oriented. Well appearing and in no acute distress. Eyes: Conjunctivae are normal. PERRL. EOMI. Head: Atraumatic. Nose: No congestion/rhinnorhea. Mouth/Throat: Mucous membranes are moist.  Oropharynx non-erythematous. Neck: No stridor.  No meningeal signs.   Cardiovascular: Normal rate, regular rhythm. Good peripheral circulation. Grossly normal heart sounds.   Respiratory: Normal respiratory effort.  No retractions. Lungs CTAB. Gastrointestinal: Soft and nontender. No distention.  Musculoskeletal: No lower extremity tenderness nor edema. No gross deformities of extremities. Neurologic:  Normal speech and language. No gross focal neurologic deficits are appreciated.  Skin:  Skin is warm, dry and intact. No rash noted.  ____________________________________________   LABS (all labs ordered are listed, but only abnormal results are displayed)  Labs Reviewed - No data to display ____________________________________________  EKG   EKG Interpretation  Date/Time:  Tuesday August 28 2019 05:10:36 EDT Ventricular Rate:  62 PR Interval:    QRS Duration: 144 QT Interval:  420 QTC Calculation: 427 R Axis:   55 Text Interpretation: Sinus rhythm Nonspecific intraventricular conduction delay No old tracing to  compare Confirmed by Marily Memos [phone]) on 08/28/2019 7:02:51 AM       ____________________________________________  RADIOLOGY  DG Chest Portable 1 View  Result Date: 08/28/2019 CLINICAL DATA:  Cough and congestion EXAM: PORTABLE CHEST 1 VIEW COMPARISON:  None. FINDINGS: Normal heart size and mediastinal contours. No acute infiltrate or edema. No effusion or pneumothorax. No acute osseous findings. IMPRESSION: Negative portable chest. Electronically Signed   By: Marnee Spring M.D.   On: 08/28/2019 05:03   ____________________________________________  PROCEDURES  Procedure(s) performed:   Procedures ____________________________________________  INITIAL IMPRESSION / ASSESSMENT AND PLAN / ED COURSE   This patient presents to the ED for concern of shortness of breath and wheezing, this involves an extensive number of treatment options, and is a complaint that carries with it a high risk of complications and morbidity.  The differential diagnosis includes pulmonary embolus, asthma exacerbation, [facility] pneumonia, heart failure.     Lab Tests:   None indicated  Medicines ordered:   I ordered medication albuterol, prednisone, ipratropium and azithromycin for wheezing and bronchitis  Imaging Studies ordered:   I independently visualized and interpreted imaging chest x-ray which showed no focal consolidation, pneumothorax, edema.  Additional history obtained:   Additional history obtained from from her husband at bedside  Previous records obtained and reviewed in epic and care everywhere  Consultations Obtained:   I consulted no one and discussed lab and imaging findings  Reevaluation:  After the interventions stated above, I reevaluated the patient and found persistent wheezing but patient felt better.  Her respiratory rate improved.  Oxygen saturation stayed consistently in the 98-100 range.  I had a discussion with the patient and her husband and feel she is  stable for discharge at this time.  Critical Interventions: None  A medical screening exam was performed and I feel the patient has had an appropriate workup for their chief complaint at this time and likelihood of emergent condition existing is low. They have been counseled on decision, discharge, follow up and which symptoms necessitate immediate return to the emergency department. They or their family verbally stated understanding and agreement with plan and discharged in stable condition.   ____________________________________________  FINAL CLINICAL IMPRESSION(S) / ED DIAGNOSES  Final diagnoses:  Shortness of breath    MEDICATIONS GIVEN DURING THIS VISIT:  Medications  albuterol (PROVENTIL) (2.5 MG/3ML) 0.083% nebulizer solution 5 mg (5 mg Nebulization Given 08/28/19 0435)  albuterol (PROVENTIL) (2.5 MG/3ML) 0.083% nebulizer solution (2.5 mg  Given 08/28/19 0434)  ipratropium (ATROVENT) nebulizer solution 1 mg (1 mg Nebulization Given 08/28/19 0448)  predniSONE (DELTASONE) tablet 60 mg (60 mg Oral Given 08/28/19 0505)  ipratropium (ATROVENT) 0.02 % nebulizer solution (0.5 mg  Given 08/28/19 0450)  albuterol (PROVENTIL) (2.5 MG/3ML) 0.083% nebulizer solution 5 mg (5 mg Nebulization Given 08/28/19 0552)  azithromycin (ZITHROMAX) tablet 500 mg (500 mg Oral Given 08/28/19 0616)    NEW OUTPATIENT MEDICATIONS STARTED DURING THIS VISIT:  Discharge Medication List as of 08/28/2019  6:13 AM    START taking these medications   Details  azithromycin (ZITHROMAX) 250 MG tablet Take 1 tablet (250 mg total) by mouth daily. Take first 2 tablets together, then 1 every day until finished., Starting Tue 08/28/2019, Normal        Note:  This note was prepared with assistance of Dragon voice recognition software. Occasional wrong-word or sound-a-like substitutions may have occurred due to the inherent limitations of voice recognition software.   Orrin Yurkovich, Corene Cornea, MD 08/28/19 [phone]

## 2021-05-16 IMAGING — DX DG CHEST 1V PORT
1 series · 1 of 1 positions shown · non-contrast
Comparison: None.

CLINICAL DATA: Cough and congestion

EXAM:
PORTABLE CHEST 1 VIEW

[chest ap]
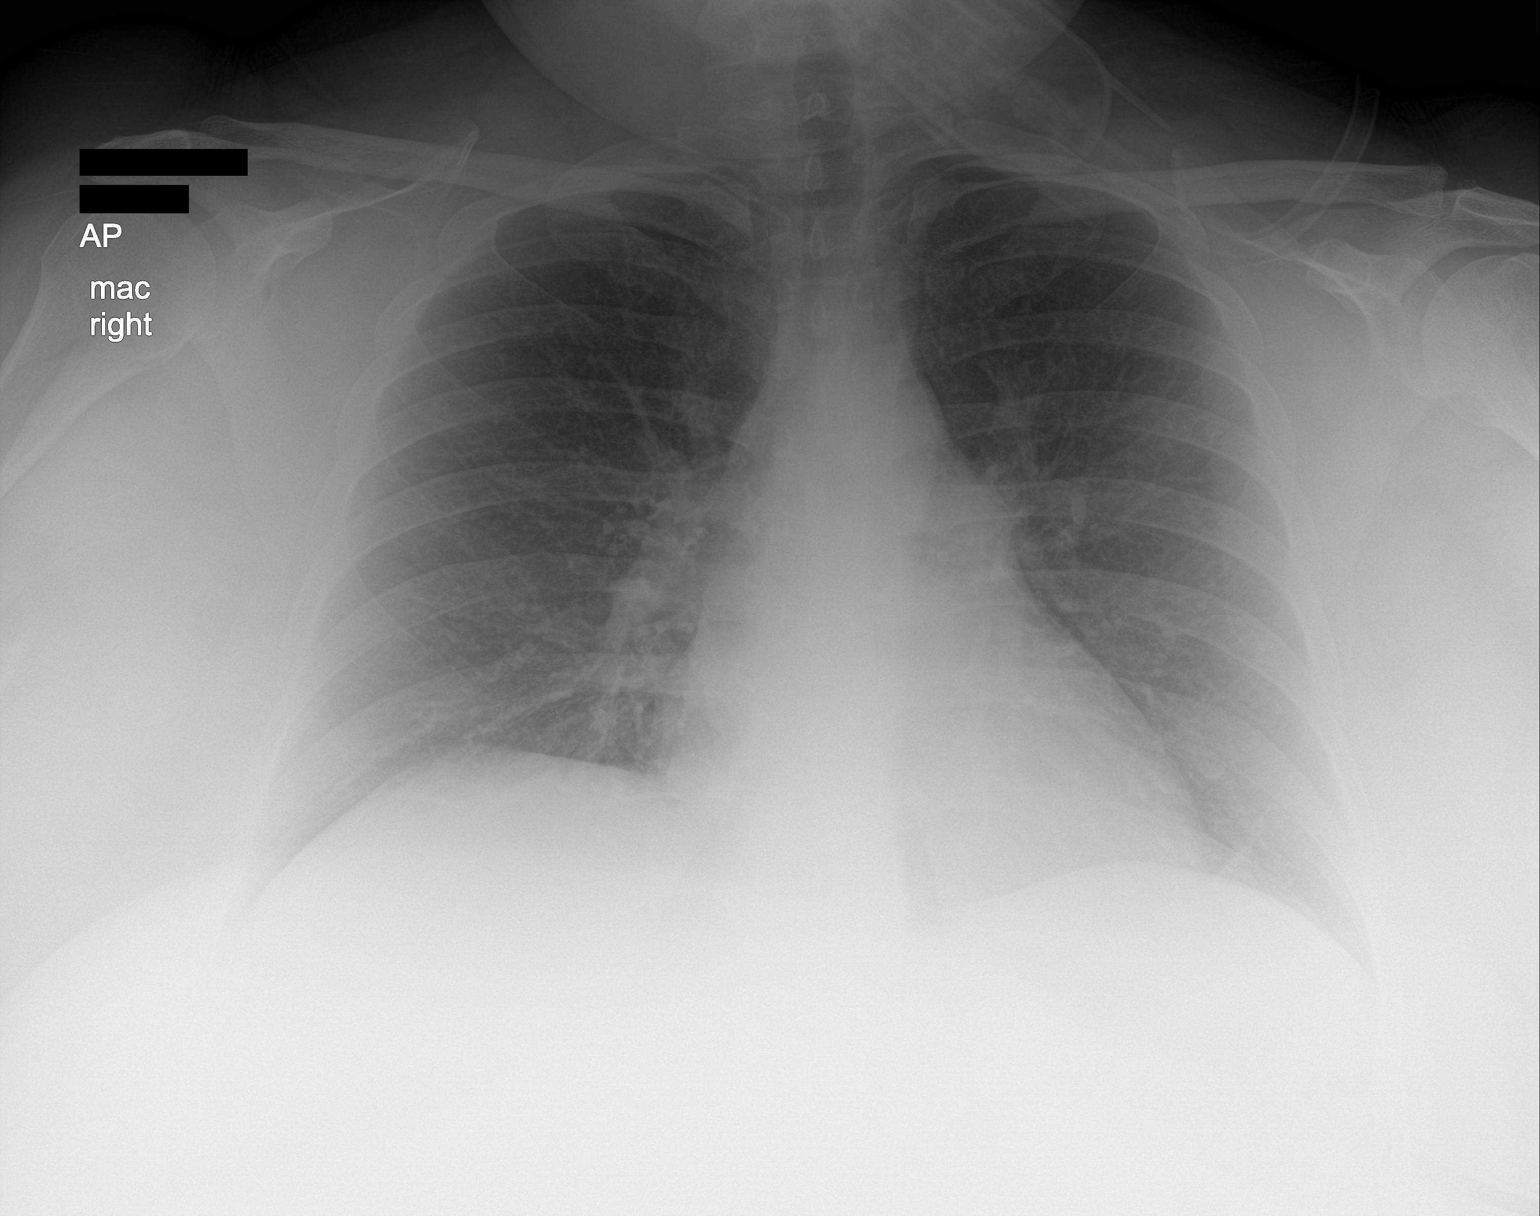

[1 of 1 positions shown; findings below may reference images not displayed]

FINDINGS: Normal heart size and mediastinal contours. No acute infiltrate or
edema. No effusion or pneumothorax. No acute osseous findings.
IMPRESSION: Negative portable chest.
